# Patient Record
Sex: Female | Born: 1940 | Race: White | Hispanic: No | Marital: Married
Health system: Southern US, Community
[De-identification: ages and names within clinical notes are randomized; demographics above are authoritative.]

---

## 2007-01-07 ENCOUNTER — Ambulatory Visit: Payer: Self-pay | Admitting: Family Medicine

## 2007-02-03 ENCOUNTER — Ambulatory Visit: Payer: Self-pay | Admitting: Gastroenterology

## 2007-06-11 ENCOUNTER — Ambulatory Visit: Payer: Self-pay | Admitting: Orthopaedic Surgery

## 2007-06-11 ENCOUNTER — Other Ambulatory Visit: Payer: Self-pay

## 2007-06-18 ENCOUNTER — Ambulatory Visit: Payer: Self-pay | Admitting: Orthopaedic Surgery

## 2009-04-04 ENCOUNTER — Ambulatory Visit: Payer: Self-pay | Admitting: Family Medicine

## 2009-04-10 ENCOUNTER — Ambulatory Visit: Payer: Self-pay | Admitting: Family Medicine

## 2009-05-15 ENCOUNTER — Ambulatory Visit: Payer: Self-pay | Admitting: Gastroenterology

## 2009-05-18 ENCOUNTER — Ambulatory Visit: Payer: Self-pay | Admitting: Gastroenterology

## 2011-07-26 ENCOUNTER — Ambulatory Visit: Payer: Self-pay | Admitting: Gastroenterology

## 2011-07-30 LAB — PATHOLOGY REPORT

## 2013-10-17 ENCOUNTER — Emergency Department (HOSPITAL_COMMUNITY): Payer: Medicare Other

## 2013-10-17 ENCOUNTER — Inpatient Hospital Stay (HOSPITAL_COMMUNITY)
Admission: EM | Admit: 2013-10-17 | Discharge: 2013-11-03 | DRG: 064 | Disposition: E | Payer: Medicare Other | Attending: Emergency Medicine | Admitting: Emergency Medicine

## 2013-10-17 ENCOUNTER — Encounter (HOSPITAL_COMMUNITY): Payer: Self-pay | Admitting: Emergency Medicine

## 2013-10-17 DIAGNOSIS — D72829 Elevated white blood cell count, unspecified: Secondary | ICD-10-CM | POA: Diagnosis present

## 2013-10-17 DIAGNOSIS — J9600 Acute respiratory failure, unspecified whether with hypoxia or hypercapnia: Secondary | ICD-10-CM

## 2013-10-17 DIAGNOSIS — R4020 Unspecified coma: Secondary | ICD-10-CM | POA: Diagnosis present

## 2013-10-17 DIAGNOSIS — H4900 Third [oculomotor] nerve palsy, unspecified eye: Secondary | ICD-10-CM | POA: Diagnosis present

## 2013-10-17 DIAGNOSIS — I609 Nontraumatic subarachnoid hemorrhage, unspecified: Principal | ICD-10-CM

## 2013-10-17 DIAGNOSIS — I498 Other specified cardiac arrhythmias: Secondary | ICD-10-CM | POA: Diagnosis present

## 2013-10-17 DIAGNOSIS — E876 Hypokalemia: Secondary | ICD-10-CM | POA: Diagnosis present

## 2013-10-17 DIAGNOSIS — I959 Hypotension, unspecified: Secondary | ICD-10-CM | POA: Diagnosis present

## 2013-10-17 DIAGNOSIS — I10 Essential (primary) hypertension: Secondary | ICD-10-CM

## 2013-10-17 DIAGNOSIS — I4729 Other ventricular tachycardia: Secondary | ICD-10-CM | POA: Diagnosis present

## 2013-10-17 DIAGNOSIS — R7309 Other abnormal glucose: Secondary | ICD-10-CM | POA: Diagnosis present

## 2013-10-17 DIAGNOSIS — G9340 Encephalopathy, unspecified: Secondary | ICD-10-CM | POA: Diagnosis present

## 2013-10-17 DIAGNOSIS — I472 Ventricular tachycardia, unspecified: Secondary | ICD-10-CM | POA: Diagnosis present

## 2013-10-17 DIAGNOSIS — I214 Non-ST elevation (NSTEMI) myocardial infarction: Secondary | ICD-10-CM | POA: Diagnosis present

## 2013-10-17 DIAGNOSIS — Z66 Do not resuscitate: Secondary | ICD-10-CM | POA: Diagnosis present

## 2013-10-17 LAB — CBC
HCT: 40.7 % (ref 36.0–46.0)
HCT: 41.4 % (ref 36.0–46.0)
HEMOGLOBIN: 14.1 g/dL (ref 12.0–15.0)
Hemoglobin: 14.1 g/dL (ref 12.0–15.0)
MCH: 31.9 pg (ref 26.0–34.0)
MCH: 32 pg (ref 26.0–34.0)
MCHC: 34.6 g/dL (ref 30.0–36.0)
MCHC: 34.1 g/dL (ref 30.0–36.0)
MCV: 92.1 fL (ref 78.0–100.0)
MCV: 93.9 fL (ref 78.0–100.0)
Platelets: 276 10*3/uL (ref 150–400)
Platelets: 235 10*3/uL (ref 150–400)
RBC: 4.42 MIL/uL (ref 3.87–5.11)
RBC: 4.41 MIL/uL (ref 3.87–5.11)
RDW: 13.2 % (ref 11.5–15.5)
RDW: 13.3 % (ref 11.5–15.5)
WBC: 11.7 10*3/uL — AB (ref 4.0–10.5)
WBC: 17 10*3/uL — AB (ref 4.0–10.5)

## 2013-10-17 LAB — BLOOD GAS, ARTERIAL
ACID-BASE DEFICIT: 2.9 mmol/L — AB (ref 0.0–2.0)
Bicarbonate: 21.4 mEq/L (ref 20.0–24.0)
DRAWN BY: 24686
FIO2: 0.3 %
O2 Saturation: 98.7 %
PEEP: 5 cmH2O
PH ART: 7.383 (ref 7.350–7.450)
Patient temperature: 98.6
RATE: 14 resp/min
TCO2: 22.5 mmol/L (ref 0–100)
VT: 400 mL
pCO2 arterial: 36.8 mmHg (ref 35.0–45.0)
pO2, Arterial: 132 mmHg — ABNORMAL HIGH (ref 80.0–100.0)

## 2013-10-17 LAB — TYPE AND SCREEN
ABO/RH(D): O POS
Antibody Screen: NEGATIVE

## 2013-10-17 LAB — COMPREHENSIVE METABOLIC PANEL
ALBUMIN: 4.7 g/dL (ref 3.5–5.2)
ALT: 13 U/L (ref 0–35)
AST: 37 U/L (ref 0–37)
Alkaline Phosphatase: 74 U/L (ref 39–117)
BUN: 12 mg/dL (ref 6–23)
CHLORIDE: 98 meq/L (ref 96–112)
CO2: 22 mEq/L (ref 19–32)
CREATININE: 0.69 mg/dL (ref 0.50–1.10)
Calcium: 10.3 mg/dL (ref 8.4–10.5)
GFR calc Af Amer: >90 mL/min (ref >90)
GFR calc non Af Amer: 86 mL/min — ABNORMAL LOW (ref >90)
Glucose, Bld: 174 mg/dL — ABNORMAL HIGH (ref 70–99)
POTASSIUM: 2.9 meq/L — AB (ref 3.7–5.3)
Sodium: 143 mEq/L (ref 137–147)
Total Bilirubin: 0.3 mg/dL (ref 0.3–1.2)
Total Protein: 8.2 g/dL (ref 6.0–8.3)

## 2013-10-17 LAB — PROTIME-INR
INR: 0.98 (ref 0.00–1.49)
INR: 1.2 (ref 0.00–1.49)
Prothrombin Time: 12.8 seconds (ref 11.6–15.2)
Prothrombin Time: 14.9 seconds (ref 11.6–15.2)

## 2013-10-17 LAB — I-STAT CHEM 8, ED
BUN: 11 mg/dL (ref 6–23)
CREATININE: 0.8 mg/dL (ref 0.50–1.10)
Calcium, Ion: 1.16 mmol/L (ref 1.13–1.30)
Chloride: 101 mEq/L (ref 96–112)
GLUCOSE: 178 mg/dL — AB (ref 70–99)
HCT: 47 % — ABNORMAL HIGH (ref 36.0–46.0)
HEMOGLOBIN: 16 g/dL — AB (ref 12.0–15.0)
Potassium: 2.6 mEq/L — CL (ref 3.7–5.3)
SODIUM: 143 meq/L (ref 137–147)
TCO2: 24 mmol/L (ref 0–100)

## 2013-10-17 LAB — PHOSPHORUS: PHOSPHORUS: 2.7 mg/dL (ref 2.3–4.6)

## 2013-10-17 LAB — RAPID URINE DRUG SCREEN, HOSP PERFORMED
Amphetamines: NOT DETECTED
BARBITURATES: NOT DETECTED
Benzodiazepines: NOT DETECTED
COCAINE: NOT DETECTED
Opiates: NOT DETECTED
TETRAHYDROCANNABINOL: NOT DETECTED

## 2013-10-17 LAB — I-STAT TROPONIN, ED: TROPONIN I, POC: 0.02 ng/mL (ref 0.00–0.08)

## 2013-10-17 LAB — BASIC METABOLIC PANEL
BUN: 11 mg/dL (ref 6–23)
CO2: 19 mEq/L (ref 19–32)
Calcium: 9.5 mg/dL (ref 8.4–10.5)
Chloride: 105 mEq/L (ref 96–112)
Creatinine, Ser: 0.61 mg/dL (ref 0.50–1.10)
GFR calc non Af Amer: 89 mL/min — ABNORMAL LOW (ref >90)
GLUCOSE: 235 mg/dL — AB (ref 70–99)
POTASSIUM: 3.8 meq/L (ref 3.7–5.3)
SODIUM: 146 meq/L (ref 137–147)

## 2013-10-17 LAB — URINALYSIS, ROUTINE W REFLEX MICROSCOPIC
BILIRUBIN URINE: NEGATIVE
Glucose, UA: NEGATIVE mg/dL
Hgb urine dipstick: NEGATIVE
KETONES UR: 15 mg/dL — AB
LEUKOCYTES UA: NEGATIVE
NITRITE: NEGATIVE
Protein, ur: 100 mg/dL — AB
SPECIFIC GRAVITY, URINE: 1.01 (ref 1.005–1.030)
UROBILINOGEN UA: 0.2 mg/dL (ref 0.0–1.0)
pH: 7.5 (ref 5.0–8.0)

## 2013-10-17 LAB — DIFFERENTIAL
BASOS PCT: 0 % (ref 0–1)
Basophils Absolute: 0.1 10*3/uL (ref 0.0–0.1)
Eosinophils Absolute: 0.2 10*3/uL (ref 0.0–0.7)
Eosinophils Relative: 2 % (ref 0–5)
Lymphocytes Relative: 28 % (ref 12–46)
Lymphs Abs: 3.3 10*3/uL (ref 0.7–4.0)
MONO ABS: 0.7 10*3/uL (ref 0.1–1.0)
Monocytes Relative: 6 % (ref 3–12)
NEUTROS ABS: 7.5 10*3/uL (ref 1.7–7.7)
Neutrophils Relative %: 64 % (ref 43–77)

## 2013-10-17 LAB — APTT
APTT: 28 s (ref 24–37)
APTT: 21 s — AB (ref 24–37)

## 2013-10-17 LAB — TROPONIN I: Troponin I: 0.52 ng/mL (ref <0.30)

## 2013-10-17 LAB — ABO/RH: ABO/RH(D): O POS

## 2013-10-17 LAB — URINE MICROSCOPIC-ADD ON

## 2013-10-17 LAB — ETHANOL: Alcohol, Ethyl (B): <11 mg/dL (ref 0–11)

## 2013-10-17 LAB — MAGNESIUM: Magnesium: 1.9 mg/dL (ref 1.5–2.5)

## 2013-10-17 LAB — MRSA PCR SCREENING: MRSA by PCR: NEGATIVE

## 2013-10-17 IMAGING — CR DG CHEST 1V PORT
1 series · 1 of 1 positions shown · non-contrast
Comparison: None.

intubated.

EXAM:
PORTABLE CHEST - 1 VIEW

[AP]
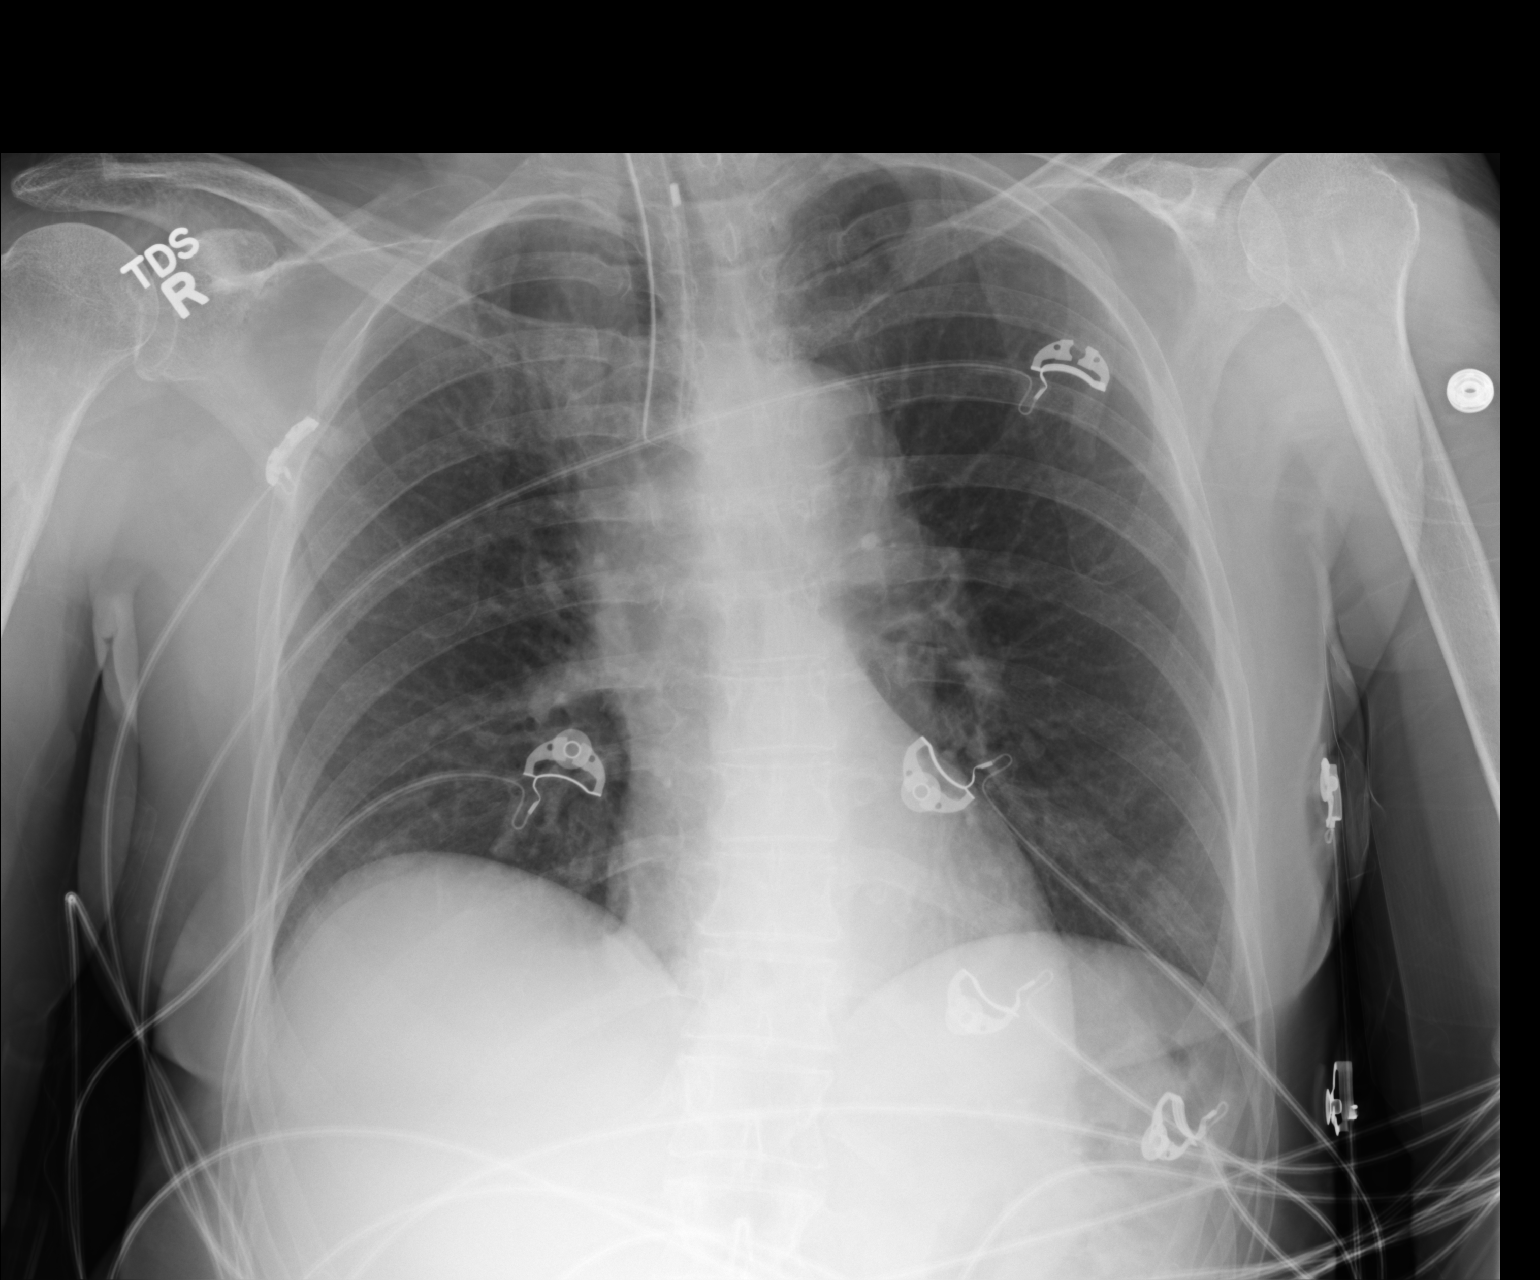

[1 of 1 positions shown; findings below may reference images not displayed]

FINDINGS: The endotracheal tube terminates 2.6 cm above the carina. The heart
size is normal. The lungs are clear. The visualized soft tissues and
bony thorax are unremarkable.
IMPRESSION: 1. Endotracheal tube 2.6 cm above the carina.
2. No acute cardiopulmonary disease.

## 2013-10-17 MED ORDER — NICARDIPINE HCL IN NACL 20-0.86 MG/200ML-% IV SOLN
3.0000 mg/h | INTRAVENOUS | Status: DC
  Administered 2013-10-17: 3 mg/h via INTRAVENOUS
  Filled 2013-10-17: qty 200

## 2013-10-17 MED ORDER — SODIUM CHLORIDE 0.9 % IV SOLN: INTRAVENOUS | Status: DC

## 2013-10-17 MED ORDER — MIDAZOLAM HCL 2 MG/2ML IJ SOLN
1.0000 mg | INTRAMUSCULAR | Status: DC | PRN
  Administered 2013-10-17: 1 mg via INTRAVENOUS
  Filled 2013-10-17: qty 2

## 2013-10-17 MED ORDER — PANTOPRAZOLE SODIUM 40 MG IV SOLR
40.0000 mg | Freq: Every day | INTRAVENOUS | Status: DC
  Administered 2013-10-17: 40 mg via INTRAVENOUS
  Filled 2013-10-17: qty 40

## 2013-10-17 MED ORDER — IOHEXOL 350 MG/ML SOLN
50.0000 mL | Freq: Once | INTRAVENOUS | Status: AC | PRN
  Administered 2013-10-17: 50 mL via INTRAVENOUS

## 2013-10-17 MED ORDER — POTASSIUM CHLORIDE 10 MEQ/100ML IV SOLN
10.0000 meq | Freq: Once | INTRAVENOUS | Status: AC
Start: 2013-10-17 — End: 2013-10-17
  Administered 2013-10-17: 10 meq via INTRAVENOUS
  Filled 2013-10-17: qty 100

## 2013-10-17 MED ORDER — MORPHINE SULFATE 2 MG/ML IJ SOLN
INTRAMUSCULAR | Status: AC
  Filled 2013-10-17: qty 2

## 2013-10-17 MED ORDER — LIDOCAINE HCL (CARDIAC) 20 MG/ML IV SOLN
INTRAVENOUS | Status: AC
  Filled 2013-10-17: qty 5

## 2013-10-17 MED ORDER — FENTANYL CITRATE 0.05 MG/ML IJ SOLN
50.0000 ug | INTRAMUSCULAR | Status: AC | PRN
  Administered 2013-10-17 (×3): 50 ug via INTRAVENOUS
  Filled 2013-10-17 (×3): qty 2

## 2013-10-17 MED ORDER — MIDAZOLAM HCL 2 MG/2ML IJ SOLN: 1.0000 mg | INTRAMUSCULAR | Status: DC | PRN

## 2013-10-17 MED ORDER — SUCCINYLCHOLINE CHLORIDE 20 MG/ML IJ SOLN
INTRAMUSCULAR | Status: AC
  Filled 2013-10-17: qty 1

## 2013-10-17 MED ORDER — SODIUM CHLORIDE 0.9 % IV SOLN
5.0000 mg/h | INTRAVENOUS | Status: DC
  Filled 2013-10-17: qty 10

## 2013-10-17 MED ORDER — FENTANYL CITRATE 0.05 MG/ML IJ SOLN
50.0000 ug | INTRAMUSCULAR | Status: DC | PRN
  Administered 2013-10-18: 50 ug via INTRAVENOUS
  Filled 2013-10-17: qty 2

## 2013-10-17 MED ORDER — NICARDIPINE HCL IN NACL 20-0.86 MG/200ML-% IV SOLN
3.0000 mg/h | Freq: Once | INTRAVENOUS | Status: AC
  Administered 2013-10-17: 5 mg/h via INTRAVENOUS
  Filled 2013-10-17: qty 200

## 2013-10-17 MED ORDER — MORPHINE SULFATE 4 MG/ML IJ SOLN
4.0000 mg | Freq: Once | INTRAMUSCULAR | Status: AC
  Administered 2013-10-17: 4 mg via INTRAVENOUS

## 2013-10-17 MED ORDER — ONDANSETRON HCL 4 MG/2ML IJ SOLN
4.0000 mg | Freq: Once | INTRAMUSCULAR | Status: AC
  Administered 2013-10-17: 4 mg via INTRAVENOUS
  Filled 2013-10-17: qty 2

## 2013-10-17 MED ORDER — POTASSIUM CHLORIDE 10 MEQ/100ML IV SOLN
10.0000 meq | INTRAVENOUS | Status: AC
  Administered 2013-10-17 (×5): 10 meq via INTRAVENOUS
  Filled 2013-10-17 (×3): qty 100

## 2013-10-17 MED ORDER — ETOMIDATE 2 MG/ML IV SOLN
20.0000 mg | Freq: Once | INTRAVENOUS | Status: AC
  Administered 2013-10-17: 20 mg via INTRAVENOUS

## 2013-10-17 MED ORDER — ROCURONIUM BROMIDE 50 MG/5ML IV SOLN
INTRAVENOUS | Status: AC
  Administered 2013-10-17: 70 mg via INTRAVENOUS
  Filled 2013-10-17: qty 2

## 2013-10-17 MED ORDER — ALBUTEROL SULFATE (2.5 MG/3ML) 0.083% IN NEBU: 2.5000 mg | INHALATION_SOLUTION | RESPIRATORY_TRACT | Status: DC | PRN

## 2013-10-17 MED ORDER — ACETAMINOPHEN 650 MG RE SUPP
650.0000 mg | Freq: Four times a day (QID) | RECTAL | Status: DC | PRN
  Administered 2013-10-17: 650 mg via RECTAL
  Filled 2013-10-17: qty 1

## 2013-10-17 MED ORDER — BIOTENE DRY MOUTH MT LIQD
15.0000 mL | Freq: Four times a day (QID) | OROMUCOSAL | Status: DC
  Administered 2013-10-18: 15 mL via OROMUCOSAL

## 2013-10-17 MED ORDER — CHLORHEXIDINE GLUCONATE 0.12 % MT SOLN
15.0000 mL | Freq: Two times a day (BID) | OROMUCOSAL | Status: DC
  Administered 2013-10-17: 15 mL via OROMUCOSAL
  Filled 2013-10-17: qty 15

## 2013-10-17 MED ORDER — ETOMIDATE 2 MG/ML IV SOLN
INTRAVENOUS | Status: AC
  Administered 2013-10-17: 20 mg via INTRAVENOUS
  Filled 2013-10-17: qty 20

## 2013-10-17 MED ORDER — ROCURONIUM BROMIDE 50 MG/5ML IV SOLN
70.0000 mg | Freq: Once | INTRAVENOUS | Status: AC
  Administered 2013-10-17: 70 mg via INTRAVENOUS
  Filled 2013-10-17: qty 7

## 2013-10-17 MED ORDER — MIDAZOLAM HCL 2 MG/2ML IJ SOLN
1.0000 mg | INTRAMUSCULAR | Status: DC | PRN
  Administered 2013-10-18: 1 mg via INTRAVENOUS
  Filled 2013-10-17: qty 2

## 2013-10-17 NOTE — H&P (Signed)
PULMONARY / CRITICAL CARE MEDICINE   Name: Amanda Berger MRN: [id number] DOB: 1940-07-07    ADMISSION DATE:  09-30-2013   REFERRING MD :  EDP/NS  PRIMARY SERVICE:  PCCM   CHIEF COMPLAINT:  SAH   BRIEF PATIENT DESCRIPTION:   73 yo presented with witnessed syncope at church brought to ER found to have extensive SAH .   SIGNIFICANT EVENTS / STUDIES:  6/14 CT head [facility]  6/14 CT head angio   LINES / TUBES: ETT 6/14 >>   CULTURES:   ANTIBIOTICS:   HISTORY OF PRESENT ILLNESS:   73 year old female with acute onset of syncope while in church today. No history of trauma. No history of symptoms prior to that. Patient otherwise in good health. No history of aneurysm or previous hemorrhage. No use of anticoagulants. In ER found to have a Extensive/ diffuse subarachnoid hemorrhage, presumably related to ruptured aneurysm. Seen by NS , w/ CT head angio ordered.  As per EMS, patient [place], [place], where she was playing the piano and suddenly grabbed her head and became unresponsive.   Right eye 5 and fixed, left eye 1, hypertensive en route , 209/108, cardene drip started. Patient moving left arm spontaneously. Pt intubated in ER for airway protection . PCCM to admit .    PAST MEDICAL HISTORY :  History reviewed. No pertinent past medical history. No past surgical history on file. Prior to Admission medications   Not on File   No Known Allergies  FAMILY HISTORY:  No family history on file. SOCIAL HISTORY:  has no tobacco, alcohol, and drug history on file.  REVIEW OF SYSTEMS:   Unable to obtain as intubated   SUBJECTIVE:   [facility]    VITAL SIGNS: Pulse Rate:  [69-141] 69 (06/14 1513) Resp:  [12-25] 12 (06/14 1513) BP: (71-183)/(46-96) 78/46 mmHg (06/14 1513) SpO2:  [96 %-100 %] 100 % (06/14 1513) FiO2 (%):  [30 %] 30 % (06/14 1437) HEMODYNAMICS:   VENTILATOR SETTINGS: Vent Mode:  [-] PRVC FiO2 (%):  [30 %] 30 % Set Rate:   [14 bmp] 14 bmp Vt Set:  [400 mL] 400 mL PEEP:  [5 cmH20] 5 cmH20 Plateau Pressure:  [13 cmH20] 13 cmH20 INTAKE / OUTPUT: Intake/Output   None     PHYSICAL EXAMINATION: General: elderly woman, intubated,  Neuro:  Comatose, no response to pain,  HEENT:  R pupil dilated, L pupil 1mm, ETT  In place Cardiovascular:  Regular, no M, all pulses 2+ Lungs:  Clear B  Abdomen:  Soft, benign Musculoskeletal: no deformities Skin: no rashes  LABS:  CBC  Recent Labs Lab 2014/04/17 1400 2014/04/17 1426  WBC 11.7*  --   HGB 14.1 16.0*  HCT 40.7 47.0*  PLT 276  --    Coag's  Recent Labs Lab 2014/04/17 1400  APTT 28  INR 0.98   BMET  Recent Labs Lab 2014/04/17 1400 2014/04/17 1426  NA 143 143  K 2.9* 2.6*  CL 98 101  CO2 22  --   BUN 12 11  CREATININE 0.69 0.80  GLUCOSE 174* 178*   Electrolytes  Recent Labs Lab 2014/04/17 1400  CALCIUM 10.3   Sepsis Markers No results found for this basename: LATICACIDVEN, PROCALCITON, O2SATVEN,  in the last 168 hours ABG No results found for this basename: PHART, PCO2ART, PO2ART,  in the last 168 hours Liver Enzymes  Recent Labs Lab 2014/04/17 1400  AST 37  ALT 13  ALKPHOS  74  BILITOT 0.3  ALBUMIN 4.7   Cardiac Enzymes No results found for this basename: TROPONINI, PROBNP,  in the last 168 hours Glucose No results found for this basename: GLUCAP,  in the last 168 hours  Imaging Ct Head Wo Contrast  10/21/2013   CLINICAL DATA:  Code stroke.  Hypertensive.  Collapsed.  EXAM: CT HEAD WITHOUT CONTRAST  TECHNIQUE: Contiguous axial images were obtained from the base of the skull through the vertex without intravenous contrast.  COMPARISON:  None.  FINDINGS: There is severe subarachnoid hemorrhage diffusely, most pronounced in the basilar cisterns and sylvian fissures, left greater than right. Blood also fills the fourth ventricle, third ventricle and also noted in the lateral ventricles. No hydrocephalus. No parenchymal hemorrhage or  ischemic infarct. No midline shift.  IMPRESSION: Extensive/ diffuse subarachnoid hemorrhage, presumably related to ruptured aneurysm, but exact cause not identified.  Rather diffuse intraventricular blood.  Critical Value/emergent results were called by telephone at the time of interpretation on 10/28/2013 at 1:54 PM to Dr. Ritta Slot , who verbally acknowledged these results.   Electronically Signed   By: Charlett Nose M.D.   On: 10/27/2013 13:54   Dg Chest Portable 1 View  10/24/2013   CLINICAL DATA:  Code stroke. Altered mental status. Patient intubated.  EXAM: PORTABLE CHEST - 1 VIEW  COMPARISON:  None.  FINDINGS: The endotracheal tube terminates 2.6 cm above the carina. The heart size is normal. The lungs are clear. The visualized soft tissues and bony thorax are unremarkable.  IMPRESSION: 1. Endotracheal tube 2.6 cm above the carina. 2. No acute cardiopulmonary disease.   Electronically Signed   By: Gennette Pac M.D.   On: 10/09/2013 14:52     CXR: pending   ASSESSMENT / PLAN:  PULMONARY A: Intubated for airway protection in setting of severe SAH   P:   Full vent support -mild hyperventilation to avoid cerebral edema VAP bundle  Check ABG in 1 hr  Daily ABG/CXR    CARDIOVASCULAR A: HTN  P:  Cardene Drip -sbp goal <180  EKG  Check troponin  SCD in place, no heparin   RENAL A:  Hypokalemia  P:   Replace K+ and follow BMP  GASTROINTESTINAL A:  No known acute issues  P:   PPI   HEMATOLOGIC A:  No known acute issues  P:  Monitor CBC     INFECTIOUS A:  No apparent infection  P:   Tr temp/wbc  Check cxr   ENDOCRINE A:  Hyperglycemia  P:   SSI   NEUROLOGIC A:  SAH  Sedated  P:   RASS goal: 0 -1 Fent/versed prn   TODAY'S SUMMARY: 73 yo admitted with severe SAH , HTN. NS w/ consult  CT head angio pending. Discussed goals of care and prognosis w family. She would not want prolonged life support if there were no chance for recovery. We agreed that  she should not undergo CPR / ACLS should she decompensate.  I have personally obtained a history, examined the patient, evaluated laboratory and imaging results, formulated the assessment and plan and placed orders.  CRITICAL CARE: The patient is critically ill with multiple organ systems failure and requires high complexity decision making for assessment and support, frequent evaluation and titration of therapies, application of advanced monitoring technologies and extensive interpretation of multiple databases. Critical Care Time devoted to patient care services described in this note is 60 minutes.   TAMMY PARRETT, NP-C  Pulmonary and Critical Care Medicine  [facility] Pager: [phone] 2014-04-20, 3:25 PM  Levy Pupaobert Arietta Eisenstein, MD, PhD 2014-04-20, 3:27 PM [facility] [phone] or if no answer [phone]

## 2013-10-17 NOTE — Progress Notes (Signed)
eLink Physician-Brief Progress Note Patient Name: Amanda Berger DOB: [birth year] MRN: [id number]  Date of Service  08/12/13   HPI/Events of Note   fever  eICU Interventions  apap suppository    Intervention Category Minor Interventions: Routine modifications to care plan (e.g. PRN medications for pain, fever)  Jamarea Selner 08/12/13, 11:29 PM

## 2013-10-17 NOTE — Progress Notes (Signed)
eLink Physician-Brief Progress Note Patient Name: Amanda AbrahamsDonella E Deruiter DOB: Dec 23, 1940 MRN: [id number]  Date of Service  10/14/2013   HPI/Events of Note   Code status clarification   eICU Interventions   DNR ordered per Dr. Kavin LeechByrum's request.  "Full code" was entered previously by error.  RN will confirm with family.    Intervention Category Intermediate Interventions: Communication with other healthcare providers and/or family  Lonia FarberZUBELEVITSKIY, Artavia Jeanlouis 10/24/2013, 4:27 PM

## 2013-10-17 NOTE — Consult Note (Signed)
Neurology Consultation Reason for Consult: code stroke Referring Physician: Freida Busmanallen, a  CC: code stroke  History is obtained from: family  HPI: Amanda Berger is a 73 y.o. female who was playing the organ at church when she grabbed her head and became unresponsive. She was borught in by EMS, and found to have a BP of 250 en route. History is limited by patient's ams.      ROS: A 14 point ROS was performed and is negative except as noted in the HPI.  PMH: "previously healthy" per EMS  Family History: Unable to assess secondary to patient's altered mental status.    Social History: Tob: Unable to assess secondary to patient's altered mental status.    Exam: Current vital signs: BP 183/96  Pulse 131  Resp 14  Ht 5\' 2"  (1.575 m)  SpO2 100% Vital signs in last 24 hours: Pulse Rate:  [86-141] 131 (06/14 1437) Resp:  [14-25] 14 (06/14 1437) BP: (168-183)/(78-96) 183/96 mmHg (06/14 1437) SpO2:  [96 %-100 %] 100 % (06/14 1437) FiO2 (%):  [30 %] 30 % (06/14 1437)  General: in bed, appears in distress CV: RRR Mental Status: Patient has eyes partially open, some spontaneous movements with her left arm. She appears lethargic Cranial Nerves: II: blinks to threat initially. Pupils are 2mm on the left with some reactivity, 5mm and fixed on right.   Discs are difficult to visualize. III,IV, VI: eyes are dysconjugate, left eye appears to have right gaze preference, right eye is midline.  V/VII: intact corneal on left, possibly decreased on right.  VIII, X, XI, XII: Unable to assess secondary to patient's altered mental status.  Motor: Tone is normal. Bulk is normal. Moves left side spontaneously, right side has some minimal movement to noxious stimuli. Sensory: Responds to nox stim in all 4 ext.  Deep Tendon Reflexes: 2+ and symmetric in the biceps and patellae.  Cerebellar: Unable to assess secondary to patient's altered mental status.  Gait: Unable to assess secondary to  patient's altered mental status.    I have reviewed labs in epic and the results pertinent to this consultation are: inr - nml  I have reviewed the images obtained:ct head - extensive SAH  Impression: 73 yo F with extensive SAH and altered concsiousness. I suspect that her prognosis is guarded. At this time, neurosurgery has been consulted and will defer to them for decisions such as need for EVD, pressure monitoring, vasospasm prophylaxis, etc. 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  Recommendations: 1) Agree with CTA to look for source.     Ritta SlotMcNeill Elvira Langston, MD [facility] [phone]  If 7pm- 7am, please page neurology on call as listed in AMION.

## 2013-10-17 NOTE — ED Provider Notes (Signed)
CSN: [id number]     Arrival date & time 10/14/2013  1338 History   First MD Initiated Contact with Patient 10/04/2013 1348     Chief Complaint  Patient presents with  . Code Stroke  . Altered Mental Status    An emergency department physician performed an initial assessment on this suspected stroke patient at 73. (Consider location/radiation/quality/duration/timing/severity/associated sxs/prior Treatment) Patient is a 73 y.o. female presenting with altered mental status. The history is limited by the condition of the patient.  Altered Mental Status  patient here after having a syncopal event at church where she was playing the by piano. She remained unresponsive and EMS was called. She did not have any seizure like activity. CBG was about 100. She would not respond to commands. She was able to protect her airway. She was transported here for further evaluation.  No past medical history on file. No past surgical history on file. No family history on file. History  Substance Use Topics  . Smoking status: Not on file  . Smokeless tobacco: Not on file  . Alcohol Use: Not on file   OB History   No data available     Review of Systems  All other systems reviewed and are negative.     Allergies  Review of patient's allergies indicates not on file.  Home Medications   Prior to Admission medications   Not on File   BP 168/78  Pulse 141  Resp 17  SpO2 100% Physical Exam  Nursing note and vitals reviewed. Constitutional: She appears well-developed and well-nourished. She has a sickly appearance. She appears ill.  HENT:  Head: Normocephalic and atraumatic.  Eyes: Conjunctivae, EOM and lids are normal. Pupils are equal, round, and reactive to light.  Right pupil was 5 mm and fixed. Left pupil was 1-2 mm and fixed. Both are nonreactive. Disconjugate gaze noted  Neck: Normal range of motion. Neck supple. No tracheal deviation present. No mass present.  Cardiovascular: Regular  rhythm and normal heart sounds.  Tachycardia present.  Exam reveals no gallop.   No murmur heard. Pulmonary/Chest: Effort normal and breath sounds normal. No stridor. No respiratory distress. She has no decreased breath sounds. She has no wheezes. She has no rhonchi. She has no rales.  Abdominal: Soft. Normal appearance and bowel sounds are normal. She exhibits no distension. There is no tenderness. There is no rebound and no CVA tenderness.  Musculoskeletal: Normal range of motion. She exhibits no edema and no tenderness.  Neurological: She is unresponsive. No sensory deficit. GCS eye subscore is 1. GCS verbal subscore is 1. GCS motor subscore is 1.  Skin: Skin is warm and dry. No abrasion and no rash noted.    ED Course  INTUBATION Date/Time: 10/14/2013 2:35 PM Performed by: Leota Jacobsen Authorized by: Lacretia Leigh T Consent: Verbal consent not obtained. The procedure was performed in an emergent situation. Time out: Immediately prior to procedure a "time out" was called to verify the correct patient, procedure, equipment, support staff and site/side marked as required. Indications: airway protection Intubation method: direct Patient status: paralyzed (RSI) Preoxygenation: BVM Sedatives: etomidate Paralytic: rocuronium Laryngoscope size: Mac 3 Tube size: 7.5 mm Tube type: cuffed Number of attempts: 1 Cricoid pressure: no Cords visualized: yes Post-procedure assessment: chest rise and CO2 detector Breath sounds: equal Cuff inflated: yes ETT to lip: 23 cm Tube secured with: ETT holder Patient tolerance: Patient tolerated the procedure well with no immediate complications.   (including critical care time) Labs  Review Labs Reviewed  I-STAT CHEM 8, ED - Abnormal; Notable for the following:    Potassium 2.6 (*)    Glucose, Bld 178 (*)    Hemoglobin 16.0 (*)    HCT 47.0 (*)    All other components within normal limits  ETHANOL  PROTIME-INR  APTT  CBC  DIFFERENTIAL   COMPREHENSIVE METABOLIC PANEL  URINE RAPID DRUG SCREEN (HOSP PERFORMED)  URINALYSIS, ROUTINE W REFLEX MICROSCOPIC  I-STAT TROPOININ, ED  I-STAT TROPOININ, ED    Imaging Review Ct Head Wo Contrast  10/11/2013   CLINICAL DATA:  Code stroke.  Hypertensive.  Collapsed.  EXAM: CT HEAD WITHOUT CONTRAST  TECHNIQUE: Contiguous axial images were obtained from the base of the skull through the vertex without intravenous contrast.  COMPARISON:  None.  FINDINGS: There is severe subarachnoid hemorrhage diffusely, most pronounced in the basilar cisterns and sylvian fissures, left greater than right. Blood also fills the fourth ventricle, third ventricle and also noted in the lateral ventricles. No hydrocephalus. No parenchymal hemorrhage or ischemic infarct. No midline shift.  IMPRESSION: Extensive/ diffuse subarachnoid hemorrhage, presumably related to ruptured aneurysm, but exact cause not identified.  Rather diffuse intraventricular blood.  Critical Value/emergent results were called by telephone at the time of interpretation on 10/20/2013 at 1:54 PM to Dr. MCNEILL KIRKPATRICK , who verbally acknowledged these results.   Electronically Signed   By: Kevin  Dover M.D.   On: 10/15/2013 13:54     EKG Interpretation None      MDM   Final diagnoses:  None    Patient was a code stroke prior to arrival and was met by the neurology team. Head CT showed severe subarachnoid hemorrhage. Neurosurgical consultation was made and after reviewing the CT scan and speaking with the family, the family has elected to continue care at this point. Patient was intubated for airway protection. I spoke with critical care and they will that the patient. Per the neurosurgeon, Dr. Poole, the patient be taken to CT to have a CT angiography.  CRITICAL CARE Performed by: ALLEN,ANTHONY T Total critical care time: 45 Critical care time was exclusive of separately billable procedures and treating other patients. Critical care was  necessary to treat or prevent imminent or life-threatening deterioration. Critical care was time spent personally by me on the following activities: development of treatment plan with patient and/or surrogate as well as nursing, discussions with consultants, evaluation of patient's response to treatment, examination of patient, obtaining history from patient or surrogate, ordering and performing treatments and interventions, ordering and review of laboratory studies, ordering and review of radiographic studies, pulse oximetry and re-evaluation of patient's condition.     Anthony T Allen, MD 10/25/2013 1439 

## 2013-10-17 NOTE — ED Notes (Signed)
Pt in by EMS with c/o while at church playing the piano, pt became stiff. Bystanders assisted pt to floor and called EMS.

## 2013-10-17 NOTE — Code Documentation (Signed)
Code stroke called at 1323, [facility] at [phone].  As per EMS, patient was [place] church, [place], where she was playing the piano and suddenly grabbed her head and became unresponsive.  NIHSS 22, on arrival, Right eye 5 and fixed, left eye 1, hypertensive in truck, in ED 209/108, cardene drip started.  Patient moving left arm spontaneously.  Patient now with snoring respirations and unresponsive, Dr. Jordan LikesPool at bedside, Patient updated by Dr. Freida BusmanAllen.

## 2013-10-17 NOTE — Consult Note (Signed)
  Reason for Consult: Subarachnoid hemorrhage Referring Physician: Code stroke  Amanda Berger is an 73 y.o. female.  HPI: 73 year old female with acute onset of syncope while in church today. No history of trauma. No history of symptoms prior to that. Patient otherwise in good health. No history of aneurysm or previous hemorrhage. No use of anticoagulants.  No past medical history on file.  No past surgical history on file.  No family history on file.  Social History:  has no tobacco, alcohol, and drug history on file.  Allergies: Allergies not on file  Medications: I have reviewed the patient's current medications.  No results found for this or any previous visit (from the past 48 hour(s)).  Ct Head Wo Contrast  10/14/2013   CLINICAL DATA:  Code stroke.  Hypertensive.  Collapsed.  EXAM: CT HEAD WITHOUT CONTRAST  TECHNIQUE: Contiguous axial images were obtained from the base of the skull through the vertex without intravenous contrast.  COMPARISON:  None.  FINDINGS: There is severe subarachnoid hemorrhage diffusely, most pronounced in the basilar cisterns and sylvian fissures, left greater than right. Blood also fills the fourth ventricle, third ventricle and also noted in the lateral ventricles. No hydrocephalus. No parenchymal hemorrhage or ischemic infarct. No midline shift.  IMPRESSION: Extensive/ diffuse subarachnoid hemorrhage, presumably related to ruptured aneurysm, but exact cause not identified.  Rather diffuse intraventricular blood.  Critical Value/emergent results were called by telephone at the time of interpretation on 11/01/2013 at 1:54 PM to Dr. Ritta SlotMCNEILL KIRKPATRICK , who verbally acknowledged these results.   Electronically Signed   By: Charlett NoseKevin  Dover M.D.   On: 10/19/2013 13:54    Review of systems not obtained due to patient factors. Blood pressure 168/78, pulse 97, resp. rate 18, SpO2 96.00%. Patient is completely unconscious. She shows no signs awakening to noxious  stimuli. Right pupil was fixed and dilated at 6 mm. Left pupil 2 mm and sluggish. Minimal corneal reflex on the left absent on right. Positive gag positive cough. Weak extension of her extremities to noxious stimuli. Examination head ears eyes or throat is unremarkable. Chest and abdomen are benign. Extremities are free from injury or deformity.  Assessment/Plan: Grade 4 subarachnoid hemorrhage likely secondary to Berger nurse will rupture. Recommend urgent intubation and mild hyperventilation. Recommend CT angiogram to evaluate for source of hemorrhage.   Amanda Berger 10/17/2013, 2:24 PM

## 2013-10-18 LAB — CBC
HEMATOCRIT: 42 % (ref 36.0–46.0)
Hemoglobin: 14.2 g/dL (ref 12.0–15.0)
MCH: 31.6 pg (ref 26.0–34.0)
MCHC: 33.8 g/dL (ref 30.0–36.0)
MCV: 93.5 fL (ref 78.0–100.0)
PLATELETS: 259 10*3/uL (ref 150–400)
RBC: 4.49 MIL/uL (ref 3.87–5.11)
RDW: 13.5 % (ref 11.5–15.5)
WBC: 16.2 10*3/uL — ABNORMAL HIGH (ref 4.0–10.5)

## 2013-10-18 LAB — BASIC METABOLIC PANEL
BUN: 10 mg/dL (ref 6–23)
CO2: 19 mEq/L (ref 19–32)
CREATININE: 0.79 mg/dL (ref 0.50–1.10)
Calcium: 10.2 mg/dL (ref 8.4–10.5)
Chloride: 104 mEq/L (ref 96–112)
GFR, EST NON AFRICAN AMERICAN: 82 mL/min — AB (ref >90)
Glucose, Bld: 136 mg/dL — ABNORMAL HIGH (ref 70–99)
POTASSIUM: 3.9 meq/L (ref 3.7–5.3)
Sodium: 145 mEq/L (ref 137–147)

## 2013-10-18 LAB — TROPONIN I: TROPONIN I: 1.75 ng/mL — AB (ref <0.30)

## 2013-10-18 MED ORDER — METOPROLOL TARTRATE 1 MG/ML IV SOLN
INTRAVENOUS | Status: AC
  Filled 2013-10-18: qty 5

## 2013-10-18 MED ORDER — METOPROLOL TARTRATE 1 MG/ML IV SOLN
2.5000 mg | INTRAVENOUS | Status: DC | PRN
  Administered 2013-10-18: 2.5 mg via INTRAVENOUS

## 2013-11-03 NOTE — Progress Notes (Signed)
Patient DNR, Family at bedside. Pt went into Vtach At 0250 and approximately 0253 pt cardiac rhythm was asystole. 2 RN's verified/auscultated no heart sounds at this point. MD was notified and the ventilator was turned off and removed from room.

## 2013-11-03 NOTE — Progress Notes (Signed)
Chaplain responded to request from Triage to escort family of Code Stroke pt to family room. I took them to consult B. Pt's husband and two daughters were present as well as two elders from their church. 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 Dr. Amada JupiterKirkpatrick informed family of pt's serious condition and that she has suffered an aneursym with bleeding around the brain. Dr. Dutch QuintPoole (neurosurgeon) came to speak with them also. I left family in the care of their church elders.

## 2013-11-03 NOTE — Progress Notes (Signed)
eLink Physician-Brief Progress Note Patient Name: Amanda AbrahamsDonella E Vespa DOB: 12-21-1940 MRN: [id number]  Date of Service  Mar 30, 2014   HPI/Events of Note   Supraventricular tachycardia with fever to 105 Goals of care > DNR, do not escalate  eICU Interventions  Cooling blanket, ice packs, APAP Try low dose metoprolol but focus care on fever   Intervention Category Intermediate Interventions: Arrhythmia - evaluation and management  Lesly Joslyn Mar 30, 2014, 1:51 AM

## 2013-11-03 DEATH — deceased

## 2013-11-11 NOTE — Discharge Summary (Signed)
PULMONARY / CRITICAL CARE MEDICINE   Name: Chales AbrahamsDonella E Flenniken MRN: [id number] DOB: July 05, 1940    ADMISSION DATE:  10/28/2013 DATE OF DEATH: May 03, 2014  PRIMARY SERVICE:  PCCM   FINAL CAUSE OF DEATH:  Subarachnoid Hemorrhage  SECONDARY CAUSES OF DEATH Acute encephalopathy Acute respiratory failure Intracerebral aneurysms, basilar and ICA arteries Hypertension, reactive to ICH Hypokalemia Stress-NSTEMI Leukocytosis   BRIEF HOSPITAL COURSE:   73 year old female with acute onset of syncope while in church on 11/01/2013. As per EMS, patient was [place], [place], where she was playing the piano and suddenly grabbed her head and became unresponsive. Right eye 5 and fixed, left eye 1, hypertensive en route , 209/108, cardene drip started. Patient moving left arm spontaneously. Pt intubated in ER for airway protection . Patient found to have a Extensive/ diffuse subarachnoid hemorrhage, presumably related to ruptured aneurysm. Seen by NSGY and no role for surgery. CT angio showed aneurysms in basilar and ICA.  She was admitted to ICU but in discussion with family and based on prognosis decision was made to place DNR orders. She developed fever, SVT, hypotension. Early am 6/15 she developed brief VT and then asystole. She expired 6/15.    Levy Pupaobert Dayon Witt, MD, PhD 11/11/2013, 9:24 AM [facility] [phone] or if no answer [phone]
